# Patient Record
Sex: Male | Born: 1950 | Hispanic: No | Marital: Married | Smoking: Never smoker
Health system: Southern US, Community
[De-identification: ages and names within clinical notes are randomized; demographics above are authoritative.]

## PROBLEM LIST (undated history)

## (undated) DIAGNOSIS — E785 Hyperlipidemia, unspecified: Secondary | ICD-10-CM

## (undated) DIAGNOSIS — H9190 Unspecified hearing loss, unspecified ear: Secondary | ICD-10-CM

## (undated) DIAGNOSIS — N429 Disorder of prostate, unspecified: Secondary | ICD-10-CM

## (undated) DIAGNOSIS — H539 Unspecified visual disturbance: Secondary | ICD-10-CM

## (undated) HISTORY — DX: Unspecified hearing loss, unspecified ear: H91.90

## (undated) HISTORY — DX: Disorder of prostate, unspecified: N42.9

## (undated) HISTORY — DX: Unspecified visual disturbance: H53.9

## (undated) HISTORY — DX: Hyperlipidemia, unspecified: E78.5

---

## 2018-11-24 DIAGNOSIS — I639 Cerebral infarction, unspecified: Secondary | ICD-10-CM

## 2018-11-24 HISTORY — DX: Cerebral infarction, unspecified: I63.9

## 2018-12-10 IMAGING — MR MR Brain W-O Contrast
8 of 12 series · 29 of 48 positions shown · IV contrast (agent unspecified)
Comparison: No priors.

MR Brain W-O Contrast
HISTORY: Abnormal CT ScanRight upper extremity numbness and tingling.
TECHNIQUE: Sagittal T1.                                                                              
 Axial T1, T2, FLAIR, GRE T2, diffusion.                                                   
 Coronal T2 FLAIR.                                                                         
 Contrast: None

[Series 5: DWI · axial · 5.0mm · 1.20mm/px · z∈[-73,+82]mm · 7 of 54 slices shown (1 of 2)]
[im 1/54]
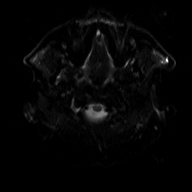
[im 9/54]
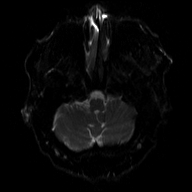
[im 18/54]
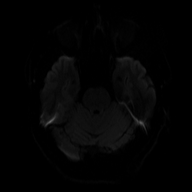
[im 27/54]
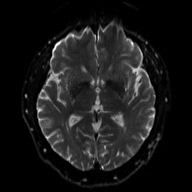
[im 36/54]
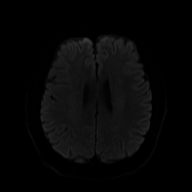
[im 45/54]
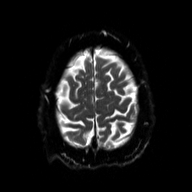
[im 54/54]
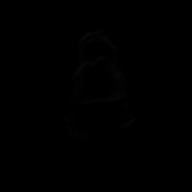

[Series 6: DWI · axial · 5.0mm · 1.20mm/px · z∈[-73,+82]mm · 3 of 27 slices shown (2 of 2)]
[im 1/27]
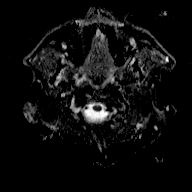
[im 14/27]
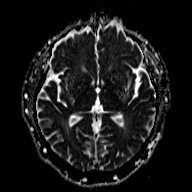
[im 27/27]
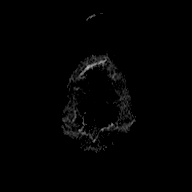

[Series 7: T1 · sagittal · 5.0mm · 0.38mm/px · 3 of 25 slices shown (1 of 2)]
[im 1/25]
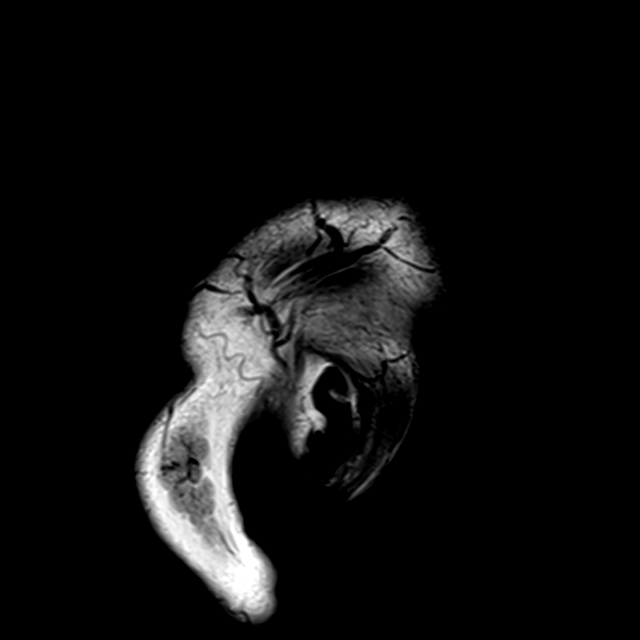
[im 13/25]
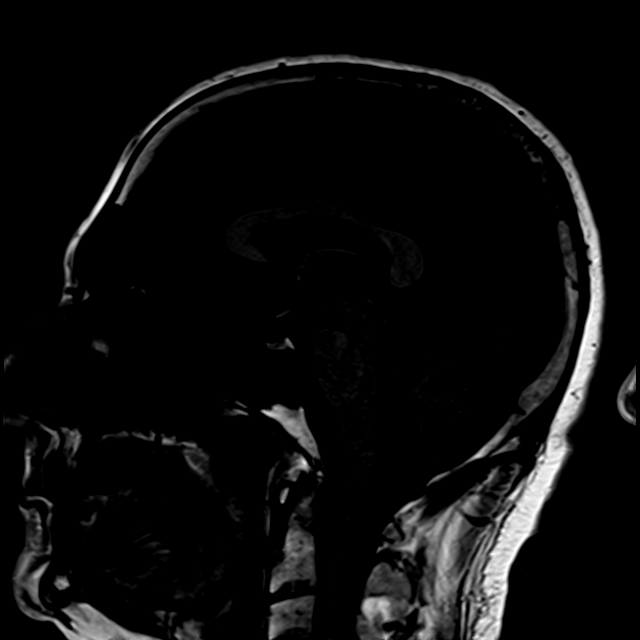
[im 25/25]
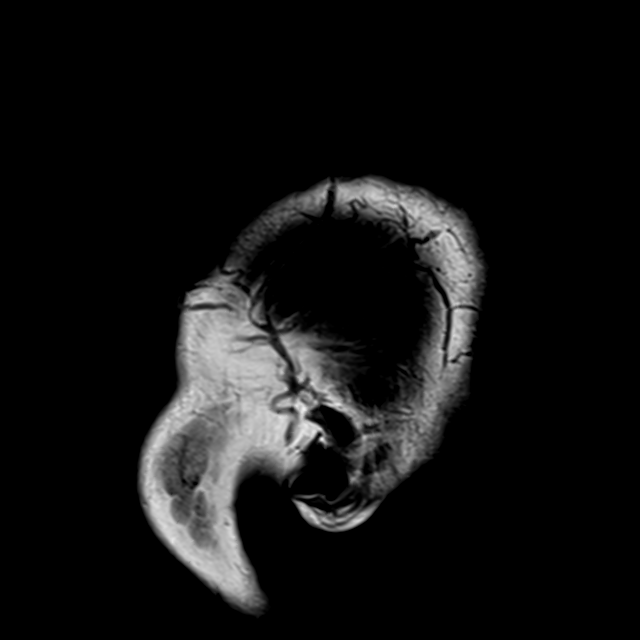

[Series 8: FLAIR · axial · 5.0mm · 0.45mm/px · z∈[-74,+81]mm · 3 of 27 slices shown (1 of 2)]
[im 1/27]
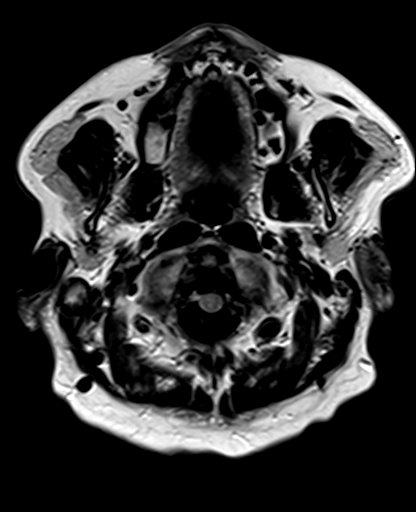
[im 14/27]
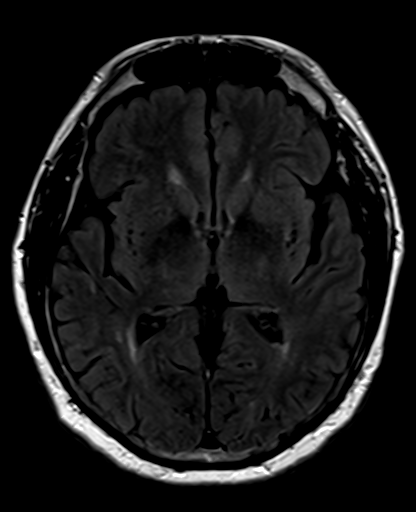
[im 27/27]
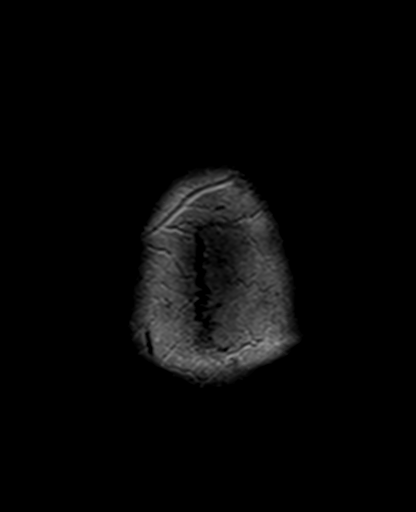

[Series 9: T2 · axial · 5.0mm · 0.36mm/px · z∈[-74,+81]mm · 3 of 27 slices shown]
[im 1/27]
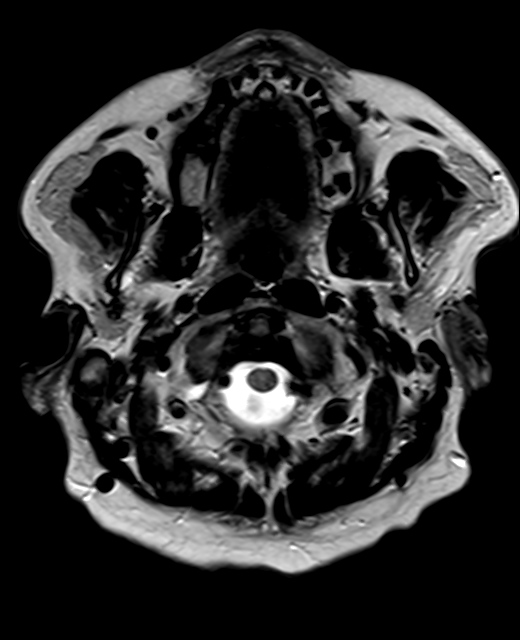
[im 14/27]
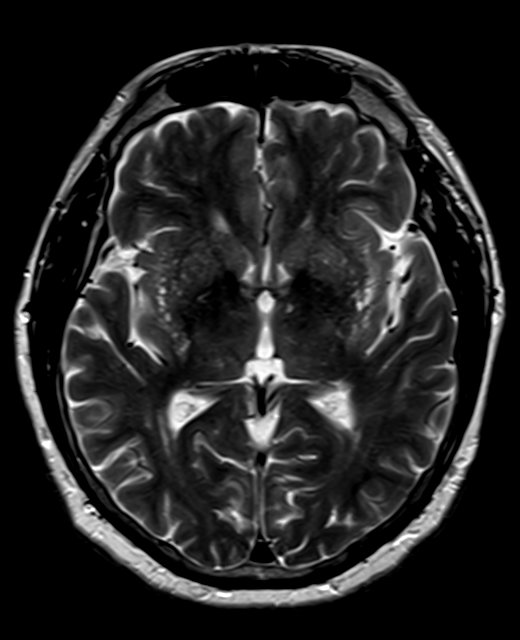
[im 27/27]
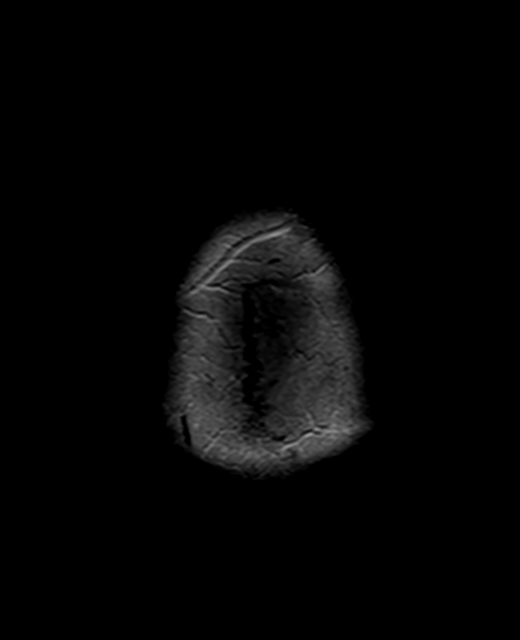

[Series 10: ax gr · axial · 5.0mm · 0.45mm/px · z∈[-74,+81]mm · 3 of 27 slices shown]
[im 1/27]
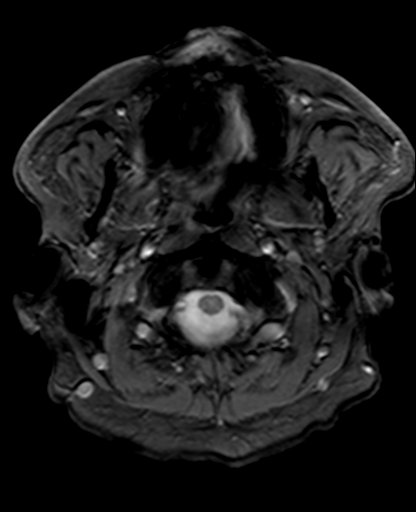
[im 14/27]
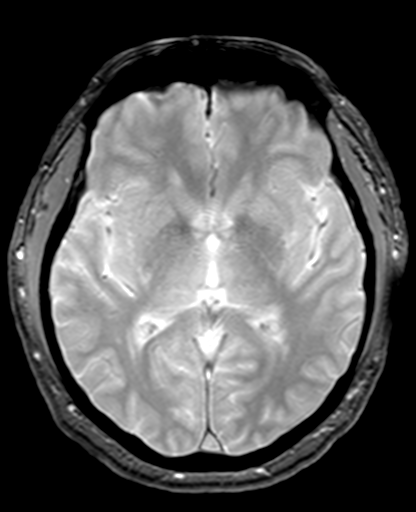
[im 27/27]
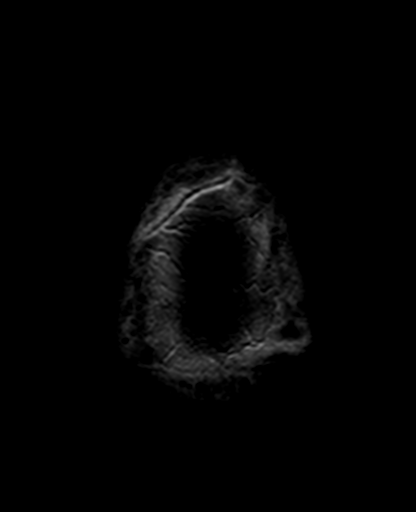

[Series 11: T1 · axial · 5.0mm · 0.72mm/px · z∈[-73,+82]mm · 3 of 27 slices shown (2 of 2)]
[im 1/27]
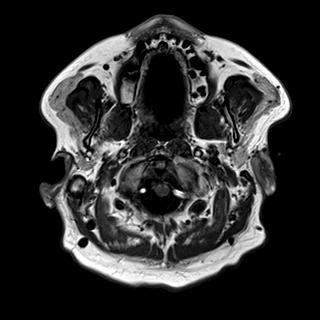
[im 14/27]
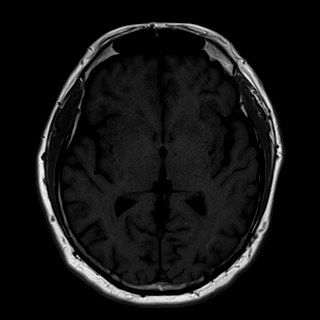
[im 27/27]
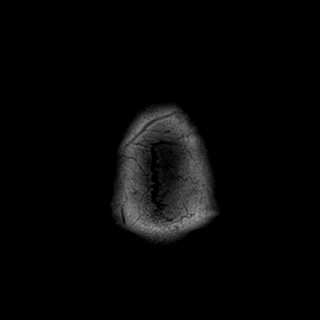

[Series 12: FLAIR · coronal · 5.0mm · 0.45mm/px · 4 of 31 slices shown (2 of 2)]
[im 1/31]
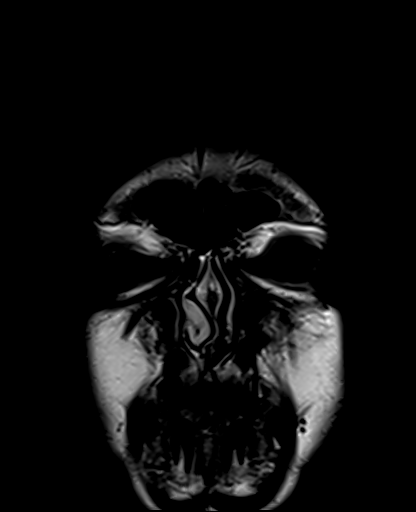
[im 11/31]
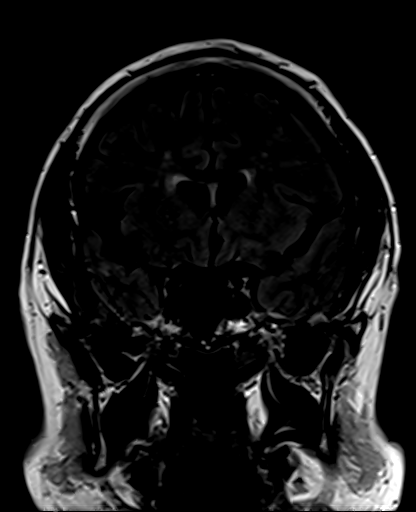
[im 21/31]
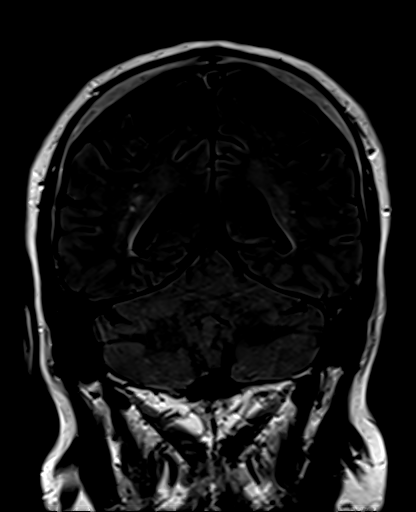
[im 31/31]
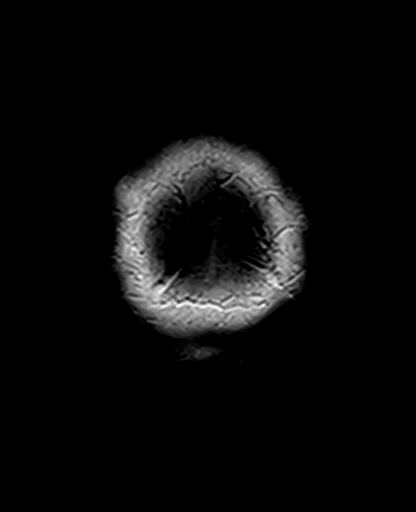

[29 of 48 positions shown; findings below may reference images not displayed]

FINDINGS: BRAIN PARENCHYMA: Small focus of restricted diffusion at the posterior left basal         
 ganglion, consistent with recent infarct. Scattered periventricular and subcortical white 
 matter T2/FLAIR hyperintensities, nonspecific but likely representing chronic small       
 vessel ischemic changes.                                                                  
 VENTRICLES: Unremarkable.                                                                 
 SULCI AND SUBARACHNOID SPACE: Prominent, consistent with atrophy. No extra-axial          
 collection.                                                                               
 FLOW VOIDS: Present.                                                                      
 MIDLINE CRANIOCERVICAL STRUCTURES: Within normal limits.                                  
 BONE MARROW: No suspicious focal abnormality.
IMPRESSION: 1.  Small focus of recent (acute to subacute) left basal ganglial infarct.                
 2.  Atrophy and probable chronic small vessel ischemic white matter changes.              
 Doc Halo message sent to Dr. Zachariah at [DATE].

## 2019-07-03 DIAGNOSIS — R9439 Abnormal result of other cardiovascular function study: Secondary | ICD-10-CM

## 2019-07-03 HISTORY — DX: Abnormal result of other cardiovascular function study: R94.39

## 2019-08-11 ENCOUNTER — Telehealth (INDEPENDENT_AMBULATORY_CARE_PROVIDER_SITE_OTHER): Payer: Self-pay | Admitting: Cardiovascular Disease

## 2019-08-11 NOTE — Telephone Encounter (Signed)
Patient calling to discuss Medical Records. Pt will be seen on 08/25/2019 by Dr. Florina Ou, pt would like for our office to call his previous cardiologist for medical records to be sent to our office. Thank you      Please advise  Dr. Junious Dresser   [facility]   T:  [phone]     Next Cardiology [facility] 08/25/2019

## 2019-08-14 NOTE — Telephone Encounter (Signed)
Records received. Patient aware.

## 2019-08-14 NOTE — Telephone Encounter (Signed)
[facility] clinic. Left message for records personal to call back. Spoke with the patient. Mark Blanchard states that he will go to Dr. Marcos Eke' office and sign records release to speed process to receive records.

## 2019-08-25 ENCOUNTER — Ambulatory Visit (INDEPENDENT_AMBULATORY_CARE_PROVIDER_SITE_OTHER): Payer: Medicare Other | Admitting: Cardiovascular Disease

## 2019-08-25 ENCOUNTER — Encounter (INDEPENDENT_AMBULATORY_CARE_PROVIDER_SITE_OTHER): Payer: Self-pay | Admitting: Cardiovascular Disease

## 2019-08-25 ENCOUNTER — Ambulatory Visit: Payer: Medicare Other | Attending: Cardiovascular Disease

## 2019-08-25 ENCOUNTER — Encounter (INDEPENDENT_AMBULATORY_CARE_PROVIDER_SITE_OTHER): Payer: Self-pay

## 2019-08-25 VITALS — BP 142/105 | HR 89 | Ht 69.0 in | Wt 224.0 lb

## 2019-08-25 DIAGNOSIS — Z01818 Encounter for other preprocedural examination: Secondary | ICD-10-CM

## 2019-08-25 DIAGNOSIS — E785 Hyperlipidemia, unspecified: Secondary | ICD-10-CM

## 2019-08-25 DIAGNOSIS — Z6833 Body mass index (BMI) 33.0-33.9, adult: Secondary | ICD-10-CM

## 2019-08-25 DIAGNOSIS — Z8249 Family history of ischemic heart disease and other diseases of the circulatory system: Secondary | ICD-10-CM

## 2019-08-25 DIAGNOSIS — Z20828 Contact with and (suspected) exposure to other viral communicable diseases: Secondary | ICD-10-CM | POA: Insufficient documentation

## 2019-08-25 DIAGNOSIS — E669 Obesity, unspecified: Secondary | ICD-10-CM

## 2019-08-25 DIAGNOSIS — R9439 Abnormal result of other cardiovascular function study: Secondary | ICD-10-CM

## 2019-08-25 DIAGNOSIS — R5383 Other fatigue: Secondary | ICD-10-CM

## 2019-08-25 DIAGNOSIS — Z01812 Encounter for preprocedural laboratory examination: Secondary | ICD-10-CM | POA: Insufficient documentation

## 2019-08-25 DIAGNOSIS — I10 Essential (primary) hypertension: Secondary | ICD-10-CM

## 2019-08-25 DIAGNOSIS — I420 Dilated cardiomyopathy: Secondary | ICD-10-CM

## 2019-08-25 LAB — CBC AND DIFFERENTIAL
Absolute NRBC: 0 10*3/uL (ref 0.00–0.00)
Basophils Absolute Automated: 0.11 10*3/uL — ABNORMAL HIGH (ref 0.00–0.08)
Basophils Automated: 1.6 %
Eosinophils Absolute Automated: 0.17 10*3/uL (ref 0.00–0.44)
Eosinophils Automated: 2.4 %
Hematocrit: 46.3 % (ref 37.6–49.6)
Hgb: 15.6 g/dL (ref 12.5–17.1)
Immature Granulocytes Absolute: 0.03 10*3/uL (ref 0.00–0.07)
Immature Granulocytes: 0.4 %
Lymphocytes Absolute Automated: 1.72 10*3/uL (ref 0.42–3.22)
Lymphocytes Automated: 24.5 %
MCH: 30.4 pg (ref 25.1–33.5)
MCHC: 33.7 g/dL (ref 31.5–35.8)
MCV: 90.3 fL (ref 78.0–96.0)
MPV: 10.6 fL (ref 8.9–12.5)
Monocytes Absolute Automated: 0.74 10*3/uL (ref 0.21–0.85)
Monocytes: 10.5 %
Neutrophils Absolute: 4.26 10*3/uL (ref 1.10–6.33)
Neutrophils: 60.6 %
Nucleated RBC: 0 /100 WBC (ref 0.0–0.0)
Platelets: 260 10*3/uL (ref 142–346)
RBC: 5.13 10*6/uL (ref 4.20–5.90)
RDW: 12 % (ref 11–15)
WBC: 7.03 10*3/uL (ref 3.10–9.50)

## 2019-08-25 LAB — BASIC METABOLIC PANEL
Anion Gap: 7 (ref 5.0–15.0)
BUN: 25 mg/dL (ref 9.0–28.0)
CO2: 28 mEq/L (ref 22–29)
Calcium: 9.9 mg/dL (ref 8.5–10.5)
Chloride: 105 mEq/L (ref 100–111)
Creatinine: 1.2 mg/dL (ref 0.7–1.3)
Glucose: 120 mg/dL — ABNORMAL HIGH (ref 70–100)
Potassium: 4.9 mEq/L (ref 3.5–5.1)
Sodium: 140 mEq/L (ref 136–145)

## 2019-08-25 LAB — GFR: EGFR: >60

## 2019-08-25 NOTE — Progress Notes (Signed)
[facility] OFFICE CONSULTATION    I had the pleasure of seeing [name] today for cardiovascular evaluation. He is a pleasant 68 y.o. male referred by Dr. For further evaluation and management of .     HPI:     This is a pleasant 68 year old gentleman referred by Dr. Junious Blanchard from the [facility] in [place], [place] for exertional fatigue, abnormal nuclear stress test and newly diagnosed cardiomyopathy in the setting of multiple CAD risk factors and a recent TIA.    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    Was in a state of reasonable health until March 2020 at which time he presented with right hand weakness/clumsiness and was diagnosed with an acute left basal ganglia stroke.  MRI of the brain showed a small defect suggestive of hypertensive lacunar event.  Since then, his motor function has returned to normal.  He had quite extensive brain imaging including 2 MRIs of the head and a CT scan and carotid duplex.  This ruled out significant carotid disease and he was treated medically for the TIA.    Over the past 6 months he has noticed worsening exertional fatigue.  This is more fatigued than dyspnea.  Gets short of breath walking a street block where he used to walk 2 miles at least several days a week and perform physical labor without much difficulty.  For this, he underwent a nuclear stress test July 03, 2019.  He exercised for 6.16 minutes on a Bruce protocol achieving 7.3 METS.  Heart rate at baseline was 76 reaching a maximum heart rate of 146 representing 94% of maximum predicted heart rate.  Blood pressure was 148/102 at rest increasing to 180/82 at peak exercise.  His resting EKG showed sinus rhythm with septal Q waves and nonspecific ST-T wave changes.  Stress EKG was not interpretable due to resting changes.  The nuclear component showed mildly reduced LV systolic function, EF 46%.  There was a moderate  reversible perfusion defect involving the inferoapical wall.  A subsequent 2D echo Doppler study showed severe global LV hypokinesis with EF 30 to 35%.  No specific wall motion abnormalities.  Aortic valve was tricuspid with no stenosis.  Mitral valve was structurally unremarkable with trace regurgitation.  Tricuspid valve showed trivial regurgitation.  No significant pulmonic valve abnormalities.    It was recommended that he undergo a heart catheterization, however the hospital in [place] has pretty well shut down secondary to Covid.  He is anxious and is seeking further care through [facility].  Interestingly, his family knows of former neighbor of mine from [facility] and they strongly recommended that he be referred to me for further evaluation.    He currently has exertional fatigue which interferes with his quality of life but no chest discomfort.  No orthopnea, PND, palpitations and/or syncope.  No TIA, amaurosis fugax or claudication over the past several months.    Compliant with medications.  No smoking.  Minimal alcohol, almost none at present.    He has a family history of heart disease.  His oldest brother died at age 54 from cardiovascular complications including stroke and heart attack.  He has another older brother who is 3 years older than him and has 3 stents and is in need of bypass surgery.  His youngest brother is 34 years younger and has diabetes mellitus and Parkinson's disease.      PAST MEDICAL HISTORY: He  has a past medical history of BPH (benign prostatic hyperplasia), Cerebrovascular accident, Hyperlipidemia, and Hypertension. He has no past surgical history on file.    MEDICATIONS:   Current Outpatient Medications   Medication Sig    aspirin EC 81 MG EC tablet Take 81 mg by mouth daily    atorvastatin (LIPITOR) 20 MG tablet Take 20 mg by mouth nightly    carvedilol (COREG) 6.25 MG tablet Take 6.25 mg by mouth 2 (two) times daily    cetirizine (ZyrTEC) 10 MG tablet Take 10 mg by  mouth daily    Coenzyme Q10 (CoQ10) 200 MG Cap Take by mouth    losartan (COZAAR) 100 MG tablet Take 100 mg by mouth daily    Multiple Vitamin (multivitamin) capsule Take 1 capsule by mouth daily    tamsulosin (FLOMAX) 0.4 MG Cap Take 0.4 mg by mouth nightly        ALLERGIES:   Allergies   Allergen Reactions    Penicillins Rash       FAMILY HISTORY:   Family History   Problem Relation Age of Onset    Arrhythmia Brother     Coronary artery disease Brother     Stent Brother     Hyperlipidemia Brother     Myocardial Infarction Brother     Stroke Brother     Diabetes Brother         SOCIAL HISTORY: He reports that he has never smoked. He has never used smokeless tobacco. He reports previous alcohol use. He reports that he does not use drugs.    REVIEW OF SYSTEMS: All other systems reviewed and negative except as above.     PHYSICAL EXAMINATION  General Appearance:  A well-appearing male in no acute distress.    Vital Signs: BP (!) 142/105    Pulse 89    Ht 1.753 m (5\' 9" )    Wt 101.6 kg (224 lb)    BMI 33.08 kg/m    HEENT: Sclera anicteric, conjunctiva without pallor, moist mucous membranes, normal dentition. No arcus.   Neck:  Supple without jugular venous distention. Thyroid nonpalpable. Normal carotid upstrokes without bruits.   Chest: Clear to auscultation bilaterally with good air movement and respiratory effort and no wheezes, rales, or rhonchi   Cardiovascular: Normal S1 and physiologically split S2 without murmurs, gallops or rub. PMI of normal size and nondisplaced.   Abdomen: Soft, nontender, nondistended, with normoactive bowel sounds. No organomegaly.  No pulsatile masses, or bruits.   Extremities: Warm without edema, clubbing, or cyanosis. All peripheral pulses are full and equal.   Skin: No rash, xanthoma or xanthelasma.   Neuro: Alert and oriented x3. Grossly intact. Strength is symmetrical. Normal mood and affect.     EKG today shows sinus rhythm with left axis deviation and possible LVH.   Poor R wave progression leads V1 through V3.    CT scan of the head 12/10/2018: Small focus of recent left basal ganglial infarct.  Atrophy and probable chronic small vessel ischemic white matter changes noted otherwise.    MR angiography of the neck with and without contrast March 2020: No significant carotid stenosis evident.    Carotid duplex March 2020: No significant obstructive carotid disease.    LABS: No results found for: WBC, HGB, HCT, PLT, NA, K, MG, BUN, CREAT, GLU, CHOL, TRIG, HDL, LDL, AST, ALT, HGBA1C, TSH     Labs were ordered today and are pending.  Prior labs from December 10, 2018 showed the following:  ESR 8 hemoglobin A1c 5.9 glucose 123 LDL 66 pH 7.38 ionized calcium 1.1 total cholesterol 165 HDL 33 triglycerides 329 cholesterol to HDL ratio 5 hemoglobin 16 WBC 7.8 platelets 256 GFR greater than 60 BUN 22 creatinine 1.09 electrolytes normal liver enzymes normal    IMPRESSION  1. Essential hypertension    2. Hyperlipidemia, unspecified hyperlipidemia type    3. Pre-op testing    4. Fatigue, unspecified type    5. Dilated cardiomyopathy    6. Abnormal stress test    7. Class 1 obesity without serious comorbidity with body mass index (BMI) of 33.0 to 33.9 in adult, unspecified obesity type          RECOMMENDATIONS:   In summary, this is a 68 year old gentleman who suffered a hypertensive lacunar left basal ganglier stroke in March 2020.     He now presents with exertional fatigue and new onset dilated cardiomyopathy with an abnormal nuclear stress test.  He has CAD risk factors that include hypertension, family history, hyperlipidemia and obesity.  Its not clear as to whether or not this represents a dilated cardiomyopathy devoid of CAD or whether or not he has CAD and cardiomyopathy.  He continues to be symptomatic with marked exertional fatigue relative to his baseline.  He has had episodes of nonsustained ventricular tachycardia on cardiac monitoring.    Regarding further cardiac investigations, I  agree that he is in need of a heart catheterization.  This will help define whether or not he has CAD and also remeasure heart function and left ventricular end-diastolic pressure.  If he has functionally significant coronary disease supplying a large territory of myocardium, PCI might need to be considered.  Although he has diffuse hypokinesis, no regions are akinetic, therefore he does likely have significant viability.  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    Regarding his cardiomyopathy, his meds are not yet fully optimized.  He will need an increase in Coreg to 12.5 mg twice daily and ultimately 25 mg p.o. twice daily.  He really should be considered for Entresto if his heart catheterization confirms consistently reduced LV systolic function.  In the future, he might need to be considered for an AICD if LV ejection fraction remains significantly below 35% despite optimal medical therapy.    Regarding his hypertension, this will need to be followed.  Hopefully this will improve with optimization of his carvedilol and ultimately introduction of Entresto in the future.    Regarding his dyslipidemia, he is on atorvastatin 20 mg daily.  This will be increased to 40 to 80 mg if he proves to have coronary disease at the time of his heart catheterization. He understands the importance of diet, exercise weight loss, avoidance of tobacco use and control of blood pressure, lipids and blood glucose as means of lowering the future risk of cardiovascular disease and CV events.    The relative risks, benefits, alternatives of invasive versus non-invasive testing, medical therapy and percutaneous coronary interventions were discussed with the patient in layman's terms.  The patient wishes to proceed with cardiac catheterization and percutaneous coronary intervention knowing that the risks include, but are not limited to death, cerebrovascular attack, myocardial infarction, stat  bypass surgery, kidney failure, bleeding, allergy, restenosis among others. Major risks were quoted at approximately 1-3%.    He has a normal Allen test involving the right arm.  We will set him up for heart catheterization later this week and further recommendations  will follow thereafter.    I have ordered a Covid test and routine blood work today.    It was a pleasure to have seen him today in consultation.

## 2019-08-26 DIAGNOSIS — I10 Essential (primary) hypertension: Secondary | ICD-10-CM

## 2019-08-26 DIAGNOSIS — R0609 Other forms of dyspnea: Secondary | ICD-10-CM

## 2019-08-26 DIAGNOSIS — R5383 Other fatigue: Secondary | ICD-10-CM

## 2019-08-26 DIAGNOSIS — R0600 Dyspnea, unspecified: Secondary | ICD-10-CM

## 2019-08-26 DIAGNOSIS — K219 Gastro-esophageal reflux disease without esophagitis: Secondary | ICD-10-CM

## 2019-08-26 DIAGNOSIS — I429 Cardiomyopathy, unspecified: Secondary | ICD-10-CM

## 2019-08-26 HISTORY — DX: Other fatigue: R53.83

## 2019-08-26 HISTORY — DX: Essential (primary) hypertension: I10

## 2019-08-26 HISTORY — DX: Dyspnea, unspecified: R06.00

## 2019-08-26 HISTORY — DX: Cardiomyopathy, unspecified: I42.9

## 2019-08-26 HISTORY — DX: Gastro-esophageal reflux disease without esophagitis: K21.9

## 2019-08-26 HISTORY — DX: Other forms of dyspnea: R06.09

## 2019-08-26 LAB — COVID-19 (SARS-COV-2): SARS CoV 2 Overall Result: NOT DETECTED

## 2019-08-27 ENCOUNTER — Encounter: Payer: Self-pay | Admitting: Cardiovascular Disease

## 2019-08-27 ENCOUNTER — Telehealth: Payer: Medicare Other

## 2019-08-27 NOTE — Pre-Procedure Instructions (Signed)
Patient's procedure was resched by MD, notified Sharolyn Douglas to call patient to verify rescheduled date and time.

## 2019-08-27 NOTE — Pre-Procedure Instructions (Signed)
·   Procedure Verified LHC 08/28/2019 @ 1000   Pt ID verified   NPO p MN reinforced , w read back   Reinforced medication instructions by MD, may take morning meds w sips of water   Arrival time at   0800 w read back, parking at [employer], check in at CATH/EP lab registration, IHVI grd flr    LAbs and covid testing results 08/24/3029 in Epic

## 2019-08-29 ENCOUNTER — Encounter: Payer: Self-pay | Admitting: Cardiovascular Disease

## 2019-08-29 ENCOUNTER — Encounter
Admission: RE | Disposition: A | Discharge status: Home / Self Care | Payer: Self-pay | Source: Ambulatory Visit | Attending: Cardiovascular Disease

## 2019-08-29 ENCOUNTER — Ambulatory Visit
Admission: RE | Admit: 2019-08-29 | Discharge: 2019-08-29 | Disposition: A | Discharge status: Home / Self Care | Payer: Medicare Other | Source: Ambulatory Visit | Attending: Cardiovascular Disease | Admitting: Cardiovascular Disease

## 2019-08-29 DIAGNOSIS — R9439 Abnormal result of other cardiovascular function study: Secondary | ICD-10-CM | POA: Insufficient documentation

## 2019-08-29 DIAGNOSIS — I429 Cardiomyopathy, unspecified: Secondary | ICD-10-CM | POA: Insufficient documentation

## 2019-08-29 DIAGNOSIS — I519 Heart disease, unspecified: Secondary | ICD-10-CM | POA: Insufficient documentation

## 2019-08-29 SURGERY — LHC W/ CORONARY ANGIOS AND LV
Anesthesia: Conscious Sedation | Laterality: Left

## 2019-08-29 MED ORDER — MIDAZOLAM HCL 1 MG/ML IJ SOLN (WRAP)
INTRAMUSCULAR | Status: AC
Start: 2019-08-29 — End: ?
  Filled 2019-08-29: qty 2

## 2019-08-29 MED ORDER — SODIUM CHLORIDE 0.9 % IV SOLN
INTRAVENOUS | Status: DC
Start: 2019-08-29 — End: 2019-08-29

## 2019-08-29 MED ORDER — CARVEDILOL 12.5 MG PO TABS
12.5000 mg | ORAL_TABLET | Freq: Two times a day (BID) | ORAL | 0 refills | Status: DC
Start: 2019-08-29 — End: 2019-10-04

## 2019-08-29 MED ORDER — LIDOCAINE HCL (PF) 1 % IJ SOLN
INTRAMUSCULAR | Status: AC
Start: 2019-08-29 — End: ?
  Filled 2019-08-29: qty 30

## 2019-08-29 MED ORDER — ASPIRIN EC 81 MG PO TBEC
81.00 mg | DELAYED_RELEASE_TABLET | Freq: Every day | ORAL | Status: DC
Start: 2019-08-29 — End: 2019-08-29
  Administered 2019-08-29: 15:00:00 81 mg via ORAL
  Filled 2019-08-29: qty 1

## 2019-08-29 MED ORDER — HEPARIN SODIUM (PORCINE) 1000 UNIT/ML IJ SOLN
INTRAMUSCULAR | Status: AC
Start: 2019-08-29 — End: ?
  Filled 2019-08-29: qty 10

## 2019-08-29 MED ORDER — NITROGLYCERIN IN D5W 200-5 MCG/ML-% IV SOLN VIAL
INTRAVENOUS | Status: AC
Start: 2019-08-29 — End: ?
  Filled 2019-08-29: qty 10

## 2019-08-29 MED ORDER — MIDAZOLAM HCL 1 MG/ML IJ SOLN (WRAP)
INTRAMUSCULAR | Status: AC | PRN
Start: 2019-08-29 — End: 2019-08-29
  Administered 2019-08-29: 1 mg via INTRAVENOUS

## 2019-08-29 MED ORDER — VERAPAMIL HCL 2.5 MG/ML IV SOLN
INTRAVENOUS | Status: AC
Start: 2019-08-29 — End: ?
  Filled 2019-08-29: qty 2

## 2019-08-29 MED ORDER — VH VERAPAMIL HCL 2.5 MG/ML IV SOLN (IR NARRATOR)
INTRAVENOUS | Status: AC | PRN
Start: 2019-08-29 — End: 2019-08-29
  Administered 2019-08-29: 5 mg via INTRA_ARTERIAL

## 2019-08-29 MED ORDER — HEPARIN SODIUM (PORCINE) 1000 UNIT/ML IJ SOLN
INTRAMUSCULAR | Status: AC | PRN
Start: 2019-08-29 — End: 2019-08-29
  Administered 2019-08-29: 5000 [IU] via INTRAVENOUS

## 2019-08-29 MED ORDER — FENTANYL CITRATE (PF) 50 MCG/ML IJ SOLN (WRAP)
INTRAMUSCULAR | Status: AC
Start: 2019-08-29 — End: ?
  Filled 2019-08-29: qty 2

## 2019-08-29 MED ORDER — HEPARIN (PORCINE) IN NACL 2000-0.9 UNIT/L-% IV SOLN
INTRAVENOUS | Status: AC
Start: 2019-08-29 — End: ?
  Filled 2019-08-29: qty 2000

## 2019-08-29 MED ORDER — NITROGLYCERIN IN D5W 200-5 MCG/ML-% IV SOLN VIAL
INTRAVENOUS | Status: AC | PRN
Start: 2019-08-29 — End: 2019-08-29
  Administered 2019-08-29: 200 ug via INTRA_ARTERIAL

## 2019-08-29 MED ORDER — IODIXANOL 320 MG/ML IV SOLN
100.00 mL | Freq: Once | INTRAVENOUS | Status: AC
Start: 2019-08-29 — End: 2019-08-29
  Administered 2019-08-29: 100 mL via INTRA_ARTERIAL

## 2019-08-29 MED ORDER — FENTANYL CITRATE (PF) 50 MCG/ML IJ SOLN (WRAP)
INTRAMUSCULAR | Status: AC | PRN
Start: 2019-08-29 — End: 2019-08-29
  Administered 2019-08-29: 50 ug via INTRAVENOUS

## 2019-08-29 MED ORDER — LIDOCAINE HCL 1 % IJ SOLN
INTRAMUSCULAR | Status: AC | PRN
Start: 2019-08-29 — End: 2019-08-29
  Administered 2019-08-29: 1 mL

## 2019-08-29 NOTE — Progress Notes (Signed)
CATH LAB PROCEDURE HANDOFF REPORT    Date Time: 08/29/19 10:59 AM    INDICATIONS:    abnormal stress test  POST PROCEDURE DEBRIEF:    Left heart cath      ALLERGIES:    Penicillins   ACC BLEEDING RISK SCORE   Total Score: 30 = INTERMEDIATE RISK for bleeding (1.1%  to 3.1%)   MEDICAL HISTORY:      Past Medical History:   Diagnosis Date    Abnormal cardiovascular stress test 07/03/2019    Abnormal vision     glasses    Cerebrovascular accident 11/2018    left basal ganglia stroke; states no residual problems    Disorder of prostate     BPH, on Rx    Dyspnea on exertion 08/2019    Fatigue 08/2019    with minimal exertion    Gastroesophageal reflux disease 08/2019    occassionally; diet controlled    Hearing loss     HOH Left ear    Hyperlipidemia     on Rx    Hypertension 08/2019    135/95    Primary cardiomyopathy 08/2019    newly diagnosed cardiomyopathy      ACCESS:    71F sheath in right radial artery  Hemostasis: Safeguard 10cc at 1050  Post procedure pulses: palpable in right arm/hand  Visual appearance: clean/dry/intact with good distal pulses   MEDICATIONS:    Versed: 1 mg IV  Fentanyl: 50 mcg IV  Heparin:  5000 units IV  Verapamil 5mg  IA  Nitroglycerin: 200 mcg IA  Loading Dose of:  PO  IV Drips:BOlus 300cc  VITALS:    HR:62          Rhythm: sinus rhythm       BP: 121/69   O2 SAT: 98%        PROCEDURE DETAILS:    Outcomes: diagnostic cath                                          Complications: none    Final Chest Pain Assessment::0/10    Report given to:  Candiss Norse RN    See Physicians Op note/ Report for details

## 2019-08-29 NOTE — Progress Notes (Signed)
Patient admitted into [facility] for left heart cath with possible PCI with Dr. Florina Ou.    ID band verified.  Patient is alert and oriented x 4. NPO status verified.  Allergies verified.   VS stable. Call bell within reach.         Pre-cath Teaching and Learning Objectives    Learner: Patient  Preference for learning: Verbal  Teaching Method: Verbal Instruction  Outcome of Learning: Fully achieved    Described/Demonstrated the following:     + Responsibilities of patient's care  + Purpose of procedure  + Need to be NPO pre-procedure  + Need for maintaining bedrest & straight leg post-procedure/sheath removal-groin sites  + Symptoms of bleeding & states plan to notify nurse.  + Radial recovery-TR band, splint, limb restrictions    MD to see patient to obtain informed consent for procedure and H&P.

## 2019-08-29 NOTE — Procedures (Signed)
FINAL CARDIAC CATHETERIZATION REPORT    OPERATOR(S)  Julianne Rice, M.D.    PROCEDURES PERFORMED:  1. Left heart catheterization.  2. Bilateral coronary angiography.  3. LV angiography.  4. Conscious sedation x 1 hour    COMPLICATIONS:  None.    INDICATIONS:  Abnormal stress test, cardiomyopathy    VASCULAR ACCESS:  Right radial artery    CATHETERS: 37F JL3.5, Tig, Pigtail catheters    CONSCIOUS SEDATION:  I was personally responsible for the administration of moderate sedation services during the procedure performed and I affirm all the guidelines and requirements described in the CPT 2017 section on moderate sedation were followed, including the use of an independent trained observer who had no other duties during the procedure. The drug(s) utilized were I.V. versed and I.V. fentanyl (see nursing log for dosing details). The total face-to-face time for conscious sedation is indicated in the procedure log.      DESCRIPTION OF PROCEDURE:  The patient provided informed consent prior to the  procedure.  An Freida Busman test confirmed dual circulation to the corresponding  palmar arch.  The patient was prepped and draped in the standard sterile  fashion and the procedure performed under conscious sedation.  Lidocaine 1% was  used for local anesthesia over the radial artery anatomic snuffbox.  The radial artery was  accessed with a micropuncture needle and the sheath advanced using an  over-the-wire technique.  Bilateral selective coronary angiography was  performed with the above-noted catheters.  Left heart catheterization was  performed.  The case was uncomplicated.     HEMODYNAMICS  LV pressure 98/0/7  Aortic pressure 103/58    CORONARY ANGIOGRAPHIC FINDINGS  The left main coronary artery comes off the left coronary sinus. The left main is free of disease. The left main bifurcates into the LAD and left circumflex. The proximal, mid and distal LAD segments are patent. The diagonal branches are patent. The first diagonal  branch shows 25% smooth ostial disease. The LAD shows 10-20% irregularity in the mid and then scattered minor luminal irregularities (MLI) otherwise. The left circumflex is patent and nondominant with scattered MLI. The obtuse marginal branches are patent. The RCA is dominant, giving rise to the RPDA and is patent. There is 20% disease at the junction between the proximal and mid. The distal branches are patent.     LV ANGIOGRAPHIC FINDINGS  LV size is normal. There was frequent ventricular ectopy noted. LV systolic function is mildly to moderately diminished with estimated LVEF of 40%.  No areas are akinetic, however, the inferior wall and septal walls are more hypokinetic than other regions    CONCLUSIONS  1. Patent coronary arteries with mild nonobstructive irregularities as noted above.   2. Mild to moderate LV systolic dysfunction with normal LVEDP and EF ~ 40%.     PLAN  1. Home  2. Increase coreg to 12.5 mg bid today  3. Continue ARB and other meds  4. See me in approximately 4 weeks to uptitrate meds  5. Repeat echo for LV function once meds optimized.

## 2019-08-29 NOTE — Progress Notes (Signed)
H&P UPDATE WITH ASA/MALLAMPATTI    Date Time: 08/29/19 9:43 AM    PROCEDURE:    Left heart cath, possible percutaneous coronary intervention  INDICATIONS:    abnormal stress test  H&P:    The history and physical including past medical, family, and social history were reviewed   and there are no significant interval changes from what is currently available in the chart  from prior evaluation. He has no complaints.  He was seen and examined by me prior   to the procedure.   ALLERGIES:    Penicillins   LABS:      Lab Results   Component Value Date    WBC 7.03 08/25/2019    HGB 15.6 08/25/2019    HCT 46.3 08/25/2019    PLT 260 08/25/2019    NA 140 08/25/2019    K 4.9 08/25/2019    CL 105 08/25/2019    CO2 28 08/25/2019    BUN 25.0 08/25/2019    CREAT 1.2 08/25/2019    EGFR >60.0 08/25/2019    GLU 120 (H) 08/25/2019     ASA PHYSICAL STATUS    Class 2 - Mild systemic disease, no functional limitations  MALLAMPATTI AIRWAY CLASSIFICATION    Class II: Visibility of hard and soft palate, upper portion of tonsils and uvula  ACC BLEEDING RISK SCORE   Total Score: 30 = INTERMEDIATE RISK for bleeding (1.1%  to 3.1%)  PLANNED SEDATION:    ( ) NO SEDATION   (x) MODERATE SEDATION   ( ) DEEP SEDATION WITH ANESTHESIA   CONCLUSION:    The risks, benefits and alternatives of the procedure have been discussed in detail and   he has indicated that he understands the procedure, indications, and risks inherent to the   procedure and is amenable to proceeding.  All questions were answered. Informed   consent was signed and verified.      Signed by: Dagmar Hait, MD

## 2019-08-29 NOTE — Discharge Instructions (Signed)
Interventional Cardiovascular Admission and Recovery  Catheterization Discharge Instructions            Access Site: Radial Artery    Activity:  1. Do not lift anything greater than ten (10) pounds in weight and no strenuous activity for 24 hours.  2. Avoid weight bearing in the affected arm and prevent flexion, extension and manipulation of the wrist area for at least 24 hours unless otherwise instructed by the physician   3. No driving for 24 hours following your procedure.  4. Ask your doctor when you should return to work. Usually you can return within 48 hours for desk jobs and within 3 (three) to five (5) days for jobs requiring heavy labor. If you had a myocardial infarction (i.e. heart attack), your physician may request that you take a longer period off work.    5. Drink 6-8 glasses of water for at least the next two (2) days to help flush your body of the contrast used during the procedure.    Access Site Care:  1. You may shower 24 hours after your procedure.  Leave the bandage in place and let the water passively flow over the site.  You may shower daily.   2. After 24 hours REMOVE the dressing before or during your shower.  Again, let the water passively flow over the site, wash gently with mild soap and water using your hand, then pat the area dry.    3. Do not submerge access site in water (dishwashing, tub bath, pool, etc) until completely healed (usually five (5) days).   4. Do not rub, pick or scratch the area.   5. Do not apply creams, powders, lotions, or ointments to the site.   6. Observe for signs of infection:  redness, warmth, swelling, drainage, or temperature greater than 100 degrees F.  If you suspect infection call the doctor who performed the procedure.  7. Monitor for bleeding and swelling. If either occurs, sit or lie down, apply manual pressure directly over the access and call the doctor who performed the procedure.     Normal Observation:  1. You may feel tenderness at the puncture  site.  May take ACETAMINOPHEN (TYLENOL) if needed.  May also use ice and elevation for site discomfort.  2. You may experience some mild bruising.      Call 911 if:  1. If you are unable to stop the bleeding with manual pressure. An arterial bleed may become an emergency if left unattended.   2. Your fingers/hand/arm has a loss of normal sensation, becomes cold, numb, painful or grayish in color.   3. You are experiencing unrelieved chest pain.    Cardiac Rehab   Your cardiologist may want you to go to cardiac rehab to help you recover. Cardiac rehab is a program that will help you get stronger and healthier. It also teaches you to make healthy lifestyle changes such as exercising and eating a heart-healthy diet. To enroll, call the cardiac rehabilitation program listed in your follow-up appointments.

## 2019-08-29 NOTE — Progress Notes (Addendum)
Received report and patient into [facility], s/p Left Heart Cath with RRA access Snuffbox intery point, no pain, no hematoma. Assumed patient's care. Bedside handoff completed with RCIS, ID band verified, alert and oriented x 3, VSS, Aldrete 10, pain 0 /10, rhythm NSR. Pt on room air. Call bell within reach. Oriented pt to to room. Hourly rounding implemented. Site Checks/Vitals/Pain assessment/Pulses checked per protocol. Educated patient on site precautions including but not limited to: bed rest, right wrist precautions, arm support by pillow, SafeGuard deflation time. Pt verbalized understanding of all education. Wife called & at bedside. Pt eating & drinking well. Pt OOB onto recliner chair.Pt bled on first deflation, & reinflated SafeGuard & observed x 30 min. Able to remove SafeGuard without problems, Tegraderm dressing applied over gauze bandage. Pt ambulated to bathroom to void.D/C home with wife. Written d/c instructions reviewed & given to pt, pt repeating info well. D/C IV & monitor, ambulated well without problems, right wrist dressing CDI, no pain, no hematoma, + pulses, Aldrete 10, escorted to car via wheelchair.

## 2019-08-29 NOTE — UM Notes (Addendum)
08/29/19 0902  PLACE IN OUTPATIENT/AMBULATORY STATUS Once    Question Answer Comment   Admitting Physician Julianne Rice B    Diagnosis Abnormal stress test            08/29/2019       S/p     PROCEDURES PERFORMED:  1. Left heart catheterization.  2. Bilateral coronary angiography.  3. LV angiography.  4. Conscious sedation x 1 hour    Plan:  Home  Increase Coreg to 12.5 BID  Continue ARB  F/u in 4 weeks to uptitrate meds   Repeat echo for LV functions once optimized       Ashok Pall RN/ACM  Utilization Review  [facility]  [address]  [place] [address]  Phone: [phone] (voicemail only)   Fax: [phone]  Email: [email or web address]  NPI: [id number]  Tax ID: [id number]

## 2019-09-01 ENCOUNTER — Telehealth (INDEPENDENT_AMBULATORY_CARE_PROVIDER_SITE_OTHER): Payer: Self-pay | Admitting: Cardiovascular Disease

## 2019-09-01 DIAGNOSIS — I517 Cardiomegaly: Secondary | ICD-10-CM

## 2019-09-01 NOTE — Telephone Encounter (Signed)
Patient calling to discuss clarification on orders . Pt was advised to get an ultrasound for his heart 1 mon after cath. No order was placed so I made an ov. Please contact pt if further instructions needed     Please advise  T:  [phone]    [facility] OV 09/22/2019

## 2019-09-03 ENCOUNTER — Encounter: Payer: Self-pay | Admitting: Cardiovascular Disease

## 2019-09-03 NOTE — Telephone Encounter (Signed)
Left message to call back. Patient will need limited echocardiogram. Order in Epic.

## 2019-09-04 NOTE — Telephone Encounter (Signed)
Patient aware.

## 2019-09-13 LAB — ECG 12-LEAD
Atrial Rate: 89 {beats}/min
P Axis: 66 degrees
P-R Interval: 158 ms
Q-T Interval: 370 ms
QRS Duration: 118 ms
QTC Calculation (Bezet): 450 ms
R Axis: -39 degrees
T Axis: 37 degrees
Ventricular Rate: 89 {beats}/min

## 2019-09-22 ENCOUNTER — Other Ambulatory Visit (INDEPENDENT_AMBULATORY_CARE_PROVIDER_SITE_OTHER): Payer: Self-pay | Admitting: Cardiovascular Disease

## 2019-09-22 ENCOUNTER — Telehealth (INDEPENDENT_AMBULATORY_CARE_PROVIDER_SITE_OTHER): Payer: Self-pay | Admitting: Cardiovascular Disease

## 2019-09-22 ENCOUNTER — Ambulatory Visit
Admission: RE | Admit: 2019-09-22 | Discharge: 2019-09-22 | Disposition: A | Discharge status: Home / Self Care | Payer: Medicare Other | Source: Ambulatory Visit | Attending: Cardiovascular Disease | Admitting: Cardiovascular Disease

## 2019-09-22 ENCOUNTER — Ambulatory Visit (INDEPENDENT_AMBULATORY_CARE_PROVIDER_SITE_OTHER): Payer: Medicare Other | Admitting: Cardiovascular Disease

## 2019-09-22 DIAGNOSIS — I517 Cardiomegaly: Secondary | ICD-10-CM

## 2019-09-22 DIAGNOSIS — I058 Other rheumatic mitral valve diseases: Secondary | ICD-10-CM | POA: Insufficient documentation

## 2019-09-22 NOTE — Telephone Encounter (Signed)
[facility] calling to request Full Body Echo Order orders.    T: [phone]  F: [phone]    [facility] OV 09/29/2019

## 2019-09-24 NOTE — Telephone Encounter (Signed)
Bertie Rao had limited echocardiogram on 12/28.

## 2019-09-29 ENCOUNTER — Encounter (INDEPENDENT_AMBULATORY_CARE_PROVIDER_SITE_OTHER): Payer: Self-pay | Admitting: Cardiovascular Disease

## 2019-09-29 ENCOUNTER — Telehealth (INDEPENDENT_AMBULATORY_CARE_PROVIDER_SITE_OTHER): Payer: Medicare Other | Admitting: Cardiovascular Disease

## 2019-09-29 VITALS — BP 142/83 | HR 88 | Ht 70.0 in | Wt 223.0 lb

## 2019-09-29 DIAGNOSIS — R5383 Other fatigue: Secondary | ICD-10-CM

## 2019-09-29 DIAGNOSIS — I420 Dilated cardiomyopathy: Secondary | ICD-10-CM

## 2019-09-29 DIAGNOSIS — E785 Hyperlipidemia, unspecified: Secondary | ICD-10-CM

## 2019-09-29 DIAGNOSIS — R9439 Abnormal result of other cardiovascular function study: Secondary | ICD-10-CM

## 2019-09-29 DIAGNOSIS — I517 Cardiomegaly: Secondary | ICD-10-CM

## 2019-09-29 DIAGNOSIS — I10 Essential (primary) hypertension: Secondary | ICD-10-CM

## 2019-09-29 DIAGNOSIS — Z6833 Body mass index (BMI) 33.0-33.9, adult: Secondary | ICD-10-CM

## 2019-09-29 DIAGNOSIS — E669 Obesity, unspecified: Secondary | ICD-10-CM

## 2019-09-29 NOTE — Progress Notes (Signed)
[facility] OFFICE VISIT          VIDEO VISIT - [facility] FOLLOW UP     CONSENT: Verbal consent has been obtained from the patient to conduct a video visit encounter to minimize exposure to COVID-19.  The time spent in medical discussion during this visit was 25 minutes.    P.P.:  Mark Blanchard was seen today for cardiovascular follow-up after being referred by Barnie Mort, P.A. and Dr. Junious Dresser from the [facility] in [place], [place] for cardiomyopathy.    HPI:     This is a pleasant 69 year old recently retired gentleman who worked for [employer] as a Pensions consultant. He was referred by Dr. Junious Dresser from the [facility] in [place], [place] for exertional fatigue, abnormal nuclear stress test and newly diagnosed cardiomyopathy in the setting of multiple CAD risk factors and a recent TIA    Was in a state of reasonable health until March 2020 at which time he presented with right hand weakness/clumsiness and was diagnosed with an acute left basal ganglia stroke.  MRI of the brain showed a small defect suggestive of hypertensive lacunar event.  Since then, his motor function has returned to normal.  He had quite extensive brain imaging including 2 MRIs of the head and a CT scan and carotid duplex.  This ruled out significant carotid disease and he was treated medically for the TIA.    Over the past 6 months, he has noticed worsening exertional fatigue.  This is more fatigued than dyspnea.  He has been getting short of breath walking a street block, whereas he used to walk 2 miles at least several days a week and perform physical labor without much difficulty.  For this, he underwent a nuclear stress test July 03, 2019.  He exercised for 6.16 minutes on a Bruce protocol achieving 7.3 METS.  Heart rate at baseline was 76 reaching a maximum heart rate of 146 representing 94% of maximum predicted heart rate.  Blood pressure was 148/102 at rest  increasing to 180/82 at peak exercise.  His resting EKG showed sinus rhythm with septal Q waves and nonspecific ST-T wave changes.  Stress EKG was not interpretable due to resting changes.  The nuclear component showed mildly reduced LV systolic function, EF 46%.  There was a moderate reversible perfusion defect involving the inferoapical wall.  A subsequent 2D echo Doppler study showed severe global LV hypokinesis with EF 30 to 35%.  No specific wall motion abnormalities.  Aortic valve was tricuspid with no stenosis.  Mitral valve was structurally unremarkable with trace regurgitation.  Tricuspid valve showed trivial regurgitation.  No significant pulmonic valve abnormalities.    He has been compliant with medications.  No smoking.  Minimal alcohol, almost none at present.    He has a family history of heart disease.  His oldest brother died at age 65 from cardiovascular complications including stroke and heart attack.  He has another older brother who is 3 years older than him and has 3 stents and is in need of bypass surgery.  His youngest brother is 12 years younger and has diabetes mellitus and Parkinson's disease.    It was recommended in [place] that he undergo a heart catheterization, however the hospital in [place] has pretty well shut down secondary to Covid.  He is anxious and is seeking further care through [facility].  Interestingly, his family knows of former neighbor of mine from [facility] and they strongly  recommended that he be referred to me for further evaluation.    Therefore, he underwent heart catheterization here at [facility] on 08/29/2019 which showed the following:    PROCEDURES PERFORMED:  1. Left heart catheterization.  2. Bilateral coronary angiography.  3. LV angiography.  4. Conscious sedation x 1 hour    HEMODYNAMICS  LV pressure 98/0/7  Aortic pressure 103/58    CORONARY ANGIOGRAPHIC FINDINGS  The left main coronary artery comes off the left coronary sinus. The left main is free of  disease. The left main bifurcates into the LAD and left circumflex. The proximal, mid and distal LAD segments are patent. The diagonal branches are patent. The first diagonal branch shows 25% smooth ostial disease. The LAD shows 10-20% irregularity in the mid and then scattered minor luminal irregularities (MLI) otherwise. The left circumflex is patent and nondominant with scattered MLI. The obtuse marginal branches are patent. The RCA is dominant, giving rise to the RPDA and is patent. There is 20% disease at the junction between the proximal and mid. The distal branches are patent.     LV ANGIOGRAPHIC FINDINGS  LV size is normal. There was frequent ventricular ectopy noted. LV systolic function is mildly to moderately diminished with estimated LVEF of 40%.  No areas are akinetic, however, the inferior wall and septal walls are more hypokinetic than other regions    CONCLUSIONS  1. Patent coronary arteries with mild nonobstructive irregularities as noted above.   2. Mild to moderate LV systolic dysfunction with normal LVEDP and EF ~ 40%.     He was discharged home with and his coreg increased  to 12.5 mg bid. He was instructed to continue his ARB and other meds. He has done so without side effects and is tolerating meds well.     He currently admits only to some persistent exertional fatigue which interferes with his quality of life but no chest discomfort.  No orthopnea, progressive dyspnea, PND, palpitations and/or syncope.  No TIA, amaurosis fugax or claudication over the past several months.      PAST MEDICAL HISTORY: He has a past medical history of Abnormal cardiovascular stress test (07/03/2019), Abnormal vision, Cerebrovascular accident (11/2018), Disorder of prostate, Dyspnea on exertion (08/2019), Fatigue (08/2019), Gastroesophageal reflux disease (08/2019), Hearing loss, Hyperlipidemia, Hypertension (08/2019), and Primary cardiomyopathy (08/2019). He has a past surgical history that includes  TONSILLECTOMY (at age 80); ADENOIDECTOMY (at age 42); Cardiac catheterization (08/29/2019); and LHC w/ Coronary Angios and LV (Left, 08/29/2019).    MEDICATIONS:   Current Outpatient Medications   Medication Sig    aspirin EC 81 MG EC tablet Take 81 mg by mouth every morning       atorvastatin (LIPITOR) 20 MG tablet Take 20 mg by mouth nightly    carvedilol (COREG) 12.5 MG tablet Take 1 tablet (12.5 mg total) by mouth 2 (two) times daily    cetirizine (ZyrTEC) 10 MG tablet Take 10 mg by mouth every morning       Coenzyme Q10 (CoQ10) 200 MG Cap Take 200 mg by mouth nightly       losartan (COZAAR) 100 MG tablet Take 100 mg by mouth every morning       Multiple Vitamin (multivitamin) capsule Take 1 capsule by mouth every morning       tamsulosin (FLOMAX) 0.4 MG Cap Take 0.4 mg by mouth nightly           ALLERGIES:   Allergies   Allergen Reactions    Penicillins  Rash       FAMILY HISTORY:   Family History   Problem Relation Age of Onset    Arrhythmia Brother     Coronary artery disease Brother     Stent Brother     Hyperlipidemia Brother     Myocardial Infarction Brother     Stroke Brother     Diabetes Brother         SOCIAL HISTORY: He reports that he has never smoked. He has never used smokeless tobacco. He reports current alcohol use of about 2.0 standard drinks of alcohol per week. He reports that he does not use drugs.    REVIEW OF SYSTEMS: All other systems reviewed and negative except as above.     PHYSICAL EXAMINATION  General Appearance:  A well-appearing male in no acute distress.    Vital Signs: BP 142/83    Pulse 88    Ht 1.778 m (5\' 10" )    Wt 101.2 kg (223 lb)    BMI 32.00 kg/m    HEENT: Sclera anicteric, conjunctiva without pallor, moist mucous membranes, normal dentition. No arcus.   Neck:  Supple without jugular venous distention. Thyroid nonpalpable. Normal carotid upstrokes without bruits.   Chest: Clear to auscultation bilaterally with good air movement and respiratory effort and no  wheezes, rales, or rhonchi   Cardiovascular: Normal S1 and physiologically split S2 without murmurs, gallops or rub. PMI of normal size and nondisplaced.   Abdomen: Soft, nontender, nondistended, with normoactive bowel sounds. No organomegaly.  No pulsatile masses, or bruits.   Extremities: Warm without edema, clubbing, or cyanosis. All peripheral pulses are full and equal.   Skin: No rash, xanthoma or xanthelasma.   Neuro: Alert and oriented x3. Grossly intact. Strength is symmetrical. Normal mood and affect.     EKG today shows sinus rhythm with left axis deviation and possible LVH.  Poor R wave progression leads V1 through V3.    CT scan of the head 12/10/2018: Small focus of recent left basal ganglial infarct.  Atrophy and probable chronic small vessel ischemic white matter changes noted otherwise.    MR angiography of the neck with and without contrast March 2020: No significant carotid stenosis evident.    Carotid duplex March 2020: No significant obstructive carotid disease.    LABS:   Lab Results   Component Value Date    WBC 7.03 08/25/2019    HGB 15.6 08/25/2019    HCT 46.3 08/25/2019    PLT 260 08/25/2019    NA 140 08/25/2019    K 4.9 08/25/2019    BUN 25.0 08/25/2019    CREAT 1.2 08/25/2019    GLU 120 (H) 08/25/2019        Labs were ordered today and are pending.  Prior labs from December 10, 2018 showed the following: ESR 8 hemoglobin A1c 5.9 glucose 123 LDL 66 pH 7.38 ionized calcium 1.1 total cholesterol 165 HDL 33 triglycerides 329 cholesterol to HDL ratio 5 hemoglobin 16 WBC 7.8 platelets 256 GFR greater than 60 BUN 22 creatinine 1.09 electrolytes normal liver enzymes normal    IMPRESSION  1. Dilated cardiomyopathy    2. Essential hypertension    3. Abnormal stress test    4. Class 1 obesity without serious comorbidity with body mass index (BMI) of 33.0 to 33.9 in adult, unspecified obesity type    5. Fatigue, unspecified type    6. Hyperlipidemia, unspecified hyperlipidemia type    7. 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  His recent heart cath at [facility] confirmed patent coronary arteries with no significant obstructive disease.  However, his LVEF remains reduced to approximately 40%.    Regarding his cardiomyopathy, his meds are not yet fully optimized.  He has already increased coreg to 12.5 mg bid and is tolerating well with his BP this am being 142/83 and he has had no bradycardia. I increased his coreg to 23 mg bid  Today and stopped his losartan, replacing it with entresto 24/26 mg daily. He will continue to check his BP daily at home. Over time, we will need to increase his entresto dosing to the maximum tolerated dose and then repeat an echo to re-evaluate his LVEF. If it remains below 35%, and AICD may need to be contemplated. However, I am hopeful this will not be the case.     Regarding his hypertension, this will need to be followed. He is checking it at home. Will tend to come down further with the med Rx optimization for his cardiomyopathy and HFrEF.     Regarding his dyslipidemia, he is on atorvastatin 20 mg daily. We will continue with this dose for now. His lipids have been well controlled.Marland Kitchen He understands the importance of diet, exercise weight loss, avoidance of tobacco use and control of blood pressure, lipids and blood glucose as means of lowering the future risk of cardiovascular disease and CV events.    I will see him again in [facility] in 3 months.

## 2019-10-03 ENCOUNTER — Other Ambulatory Visit (INDEPENDENT_AMBULATORY_CARE_PROVIDER_SITE_OTHER): Payer: Self-pay | Admitting: Cardiovascular Disease

## 2019-10-03 DIAGNOSIS — I420 Dilated cardiomyopathy: Secondary | ICD-10-CM

## 2019-10-03 NOTE — Telephone Encounter (Signed)
Patient is following up on the orders for:  1.carvedilol (COREG) 12.5 MG tablet  Dosage will be changed by the doctor.    2.To be added is Entresto.      [facility] [phone] - [place], [place] - [address] AT [facility] [address]      [phone]

## 2019-10-04 ENCOUNTER — Encounter (INDEPENDENT_AMBULATORY_CARE_PROVIDER_SITE_OTHER): Payer: Self-pay | Admitting: Cardiovascular Disease

## 2019-10-04 MED ORDER — SACUBITRIL-VALSARTAN 24-26 MG PO TABS
1.0000 | ORAL_TABLET | Freq: Two times a day (BID) | ORAL | 3 refills | Status: DC
Start: 2019-10-04 — End: 2019-10-08

## 2019-10-04 MED ORDER — CARVEDILOL 25 MG PO TABS
25.0000 mg | ORAL_TABLET | Freq: Two times a day (BID) | ORAL | 3 refills | Status: DC
Start: 2019-10-04 — End: 2019-10-08

## 2019-10-08 NOTE — Telephone Encounter (Signed)
Prescriptions were sent to pharmacy at visit. Will resend.

## 2019-10-09 MED ORDER — SACUBITRIL-VALSARTAN 24-26 MG PO TABS
1.00 | ORAL_TABLET | Freq: Two times a day (BID) | ORAL | 0 refills | Status: AC
Start: 2019-10-09 — End: 2020-10-08

## 2019-10-09 MED ORDER — CARVEDILOL 25 MG PO TABS
25.0000 mg | ORAL_TABLET | Freq: Two times a day (BID) | ORAL | 0 refills | Status: DC
Start: 2019-10-09 — End: 2020-05-19

## 2019-11-24 ENCOUNTER — Encounter (INDEPENDENT_AMBULATORY_CARE_PROVIDER_SITE_OTHER): Payer: Self-pay

## 2020-05-19 ENCOUNTER — Other Ambulatory Visit (INDEPENDENT_AMBULATORY_CARE_PROVIDER_SITE_OTHER): Payer: Self-pay

## 2020-05-19 DIAGNOSIS — I420 Dilated cardiomyopathy: Secondary | ICD-10-CM

## 2020-05-19 NOTE — Telephone Encounter (Signed)
Reviewed AF

## 2020-05-22 MED ORDER — CARVEDILOL 25 MG PO TABS
25.0000 mg | ORAL_TABLET | Freq: Two times a day (BID) | ORAL | 0 refills | Status: DC
Start: 2020-05-22 — End: 2020-09-07

## 2020-09-07 ENCOUNTER — Other Ambulatory Visit (INDEPENDENT_AMBULATORY_CARE_PROVIDER_SITE_OTHER): Payer: Self-pay

## 2020-09-07 DIAGNOSIS — I420 Dilated cardiomyopathy: Secondary | ICD-10-CM

## 2020-09-07 NOTE — Telephone Encounter (Signed)
Reviewed NS

## 2020-09-11 MED ORDER — CARVEDILOL 25 MG PO TABS
25.00 mg | ORAL_TABLET | Freq: Two times a day (BID) | ORAL | 0 refills | Status: AC
Start: 2020-09-11 — End: ?
